# Patient Record
Sex: Male | Born: 1950 | Race: White | Hispanic: No | Smoking: Never smoker
Health system: Southern US, Community
[De-identification: ages and names within clinical notes are randomized; demographics above are authoritative.]

## PROBLEM LIST (undated history)

## (undated) HISTORY — PX: PROSTATE BIOPSY: SHX241

## (undated) HISTORY — PX: APPENDECTOMY: SHX54

## (undated) HISTORY — PX: CATARACT EXTRACTION: SUR2

## (undated) HISTORY — PX: VASECTOMY: SHX75

## (undated) HISTORY — PX: HERNIA REPAIR: SHX51

## (undated) HISTORY — PX: VASECTOMY REVERSAL: SHX243

---

## 2022-04-03 ENCOUNTER — Other Ambulatory Visit: Payer: Self-pay

## 2022-04-06 ENCOUNTER — Ambulatory Visit: Payer: Medicare Other | Admitting: Gastroenterology

## 2022-04-06 ENCOUNTER — Encounter: Payer: Self-pay | Admitting: Gastroenterology

## 2022-04-06 VITALS — BP 144/90 | HR 46 | Temp 97.8°F | Ht 66.0 in | Wt 139.0 lb

## 2022-04-06 DIAGNOSIS — R1032 Left lower quadrant pain: Secondary | ICD-10-CM

## 2022-04-06 DIAGNOSIS — R197 Diarrhea, unspecified: Secondary | ICD-10-CM

## 2022-04-06 NOTE — Patient Instructions (Addendum)
Your CT scan has been scheduled for April 10, 2022 arrive at 12:30pm to [facility] Entrance.   You can not have anything to eat or drink 4 hours prior.  You will need to go pick up oral prep today.  If you need to cancel or reschedule, please call [phone]

## 2022-04-06 NOTE — Progress Notes (Signed)
Gastroenterology Consultation  Referring Provider:     [name], MD Primary Care Physician:  Jason Hartigan, MD Primary Gastroenterologist:  Dr. Allen Dunn     Reason for Consultation:     Abdominal pain and diarrhea        HPI:   Jason Dunn is a 71 y.o. y/o male referred for consultation & management of Abdominal pain and diarrhea by Dr. Ellison Dunn, Jason Noa, MD.  This patient comes in today with a report of abdominal pain.  He reports that he started having diarrhea and the left-sided abdominal pain the Friday before Memorial Day.  The patient had seen his primary care provider and reported that he was given antibiotics for diverticulitis.  The patient reports that his had abdominal pain for a long time but not as bad as it has been recently and the diarrhea has been bothersome.  There is no report of any unexplained weight loss fevers chills nausea vomiting black stools or bloody stools.  The patient states he was started on Flagyl and Cipro.  He denies having any stool cultures sent off for his diarrhea nor was he sent for a CT scan of the abdomen.  He reports that he has had 3 colonoscopies in the past and underwent normal with his last colonoscopy being in 2017.  No past medical history on file.    Prior to Admission medications   Medication Sig Start Date End Date Taking? Authorizing Provider  amLODipine (NORVASC) 5 MG tablet Take 5 mg by mouth daily.   Yes [provider]  atorvastatin (LIPITOR) 20 MG tablet Take 20 mg by mouth daily.   Yes [provider]  brinzolamide (AZOPT) 1 % ophthalmic suspension 1 drop 3 (three) times daily.   Yes [provider]  ciprofloxacin (CIPRO) 500 MG tablet Take 500 mg by mouth 2 (two) times daily. 03/30/22  Yes [provider]  dorzolamide-timolol (COSOPT) 22.3-6.8 MG/ML ophthalmic solution 1 drop 2 (two) times daily.   Yes [provider]  finasteride (PROSCAR) 5 MG tablet Take 5 mg by mouth  daily.   Yes [provider]  gabapentin (NEURONTIN) 100 MG capsule Take 100 mg by mouth 3 (three) times daily.   Yes [provider]  gabapentin (NEURONTIN) 300 MG capsule Take 300 mg by mouth at bedtime. 01/27/22  Yes [provider]  latanoprost (XALATAN) 0.005 % ophthalmic solution 1 drop at bedtime.   Yes [provider]  metroNIDAZOLE (FLAGYL) 500 MG tablet Take 500 mg by mouth 3 (three) times daily. 03/30/22  Yes [provider]  Multiple Vitamin (MULTIVITAMIN) tablet Take 1 tablet by mouth daily.   Yes [provider]  naproxen (NAPROSYN) 500 MG tablet Take 500 mg by mouth 2 (two) times daily with a meal.   Yes [provider]  pantoprazole (PROTONIX) 40 MG tablet Take 40 mg by mouth daily.   Yes [provider]  sertraline (ZOLOFT) 100 MG tablet Take 100 mg by mouth daily.   Yes [provider]  silodosin (RAPAFLO) 8 MG CAPS capsule Take 8 mg by mouth daily. 03/04/22  Yes [provider]  tadalafil (CIALIS) 5 MG tablet Take 5 mg by mouth daily. 03/18/22  Yes [provider]  vitamin B-12 (CYANOCOBALAMIN) 1000 MCG tablet Take 1,000 mcg by mouth daily.   Yes [provider]    No family history on file.   Social History   Tobacco Use   Smoking status: Never  Smokeless tobacco: Never  Substance Use Topics   Alcohol use: Yes   Drug use: Never    Allergies as of 04/06/2022 - Review Complete 04/06/2022  Allergen Reaction Noted   Aloe vera Rash 04/03/2022    Review of Systems:    All systems reviewed and negative except where noted in HPI.   Physical Exam:  BP (!) 144/90   Pulse (!) 46   Temp 97.8 F (36.6 C) (Oral)   Ht 5\' 6"  (1.676 m)   Wt 139 lb (63 kg)   BMI 22.44 kg/m  No LMP for male patient. General:   Alert,  Well-developed, well-nourished, pleasant and cooperative in NAD Head:  Normocephalic and atraumatic. Eyes:  Sclera clear, no icterus.   Conjunctiva  pink. Ears:  Normal auditory acuity. Neck:  Supple; no masses or thyromegaly. Lungs:  Respirations even and unlabored.  Clear throughout to auscultation.   No wheezes, crackles, or rhonchi. No acute distress. Heart:  Regular rate and rhythm; no murmurs, clicks, rubs, or gallops. Abdomen:  Normal bowel sounds.  No bruits.  Soft, Positive tenderness in the left lower quadrant and non-distended without masses, hepatosplenomegaly or hernias noted.  No guarding tenderness.  Rebound tenderness was present. Negative Carnett sign.   Rectal:  Deferred.  Pulses:  Normal pulses noted. Extremities:  No clubbing or edema.  No cyanosis. Neurologic:  Alert and oriented x3;  grossly normal neurologically. Skin:  Intact without significant lesions or rashes.  No jaundice. Lymph Nodes:  No significant cervical adenopathy. Psych:  Alert and cooperative. Normal mood and affect.  Imaging Studies: No results found.  Assessment and Plan:   Jason Dunn is a 71 y.o. y/o male Who comes in today with left-sided abdominal pain with mild rebound tenderness and diarrhea.  The patient will have a CT scan of the abdomen done to look for signs of complicated diverticulitis.  He denies being told that he had diverticulosis on any of his previous colonoscopies.  The patient will also have a GI stool panel sent off to look for any pathogens that may be causing him to have the diarrhea.  The patient has been explained the plan and agrees with it.    Lucilla Lame, MD. [facility] Regal    Note: This dictation was prepared with Dragon dictation along with smaller phrase technology. Any transcriptional errors that result from this process are unintentional.

## 2022-04-07 ENCOUNTER — Other Ambulatory Visit: Payer: Self-pay | Admitting: Gastroenterology

## 2022-04-10 ENCOUNTER — Ambulatory Visit
Admission: RE | Admit: 2022-04-10 | Discharge: 2022-04-10 | Disposition: A | Discharge status: Home / Self Care | Payer: Medicare Other | Source: Ambulatory Visit | Attending: Gastroenterology | Admitting: Gastroenterology

## 2022-04-10 DIAGNOSIS — R1032 Left lower quadrant pain: Secondary | ICD-10-CM | POA: Diagnosis present

## 2022-04-10 LAB — GI PROFILE, STOOL, PCR

## 2022-04-10 LAB — POCT I-STAT CREATININE: Creatinine, Ser: 0.8 mg/dL (ref 0.61–1.24)

## 2022-04-10 MED ORDER — IOHEXOL 300 MG/ML  SOLN
100.0000 mL | Freq: Once | INTRAMUSCULAR | Status: AC | PRN
Start: 2022-04-10 — End: 2022-04-10
  Administered 2022-04-10: 100 mL via INTRAVENOUS

## 2022-04-11 ENCOUNTER — Encounter: Payer: Self-pay | Admitting: Gastroenterology

## 2022-06-30 ENCOUNTER — Other Ambulatory Visit: Payer: Self-pay | Admitting: Family Medicine

## 2022-08-25 ENCOUNTER — Ambulatory Visit: Payer: Self-pay | Admitting: Surgery
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
## 2023-05-25 IMAGING — CT CT ABD-PELV W/ CM
2 of 5 series · 16 of 46 positions shown, 18 images · IV contrast (APPLIED)
Comparison: None Available.

CLINICAL DATA: Left lower quadrant pain and diarrhea for several
weeks.

EXAM:
CT ABDOMEN AND PELVIS WITH CONTRAST
TECHNIQUE: Multidetector CT imaging of the abdomen and pelvis was performed
using the standard protocol following bolus administration of
intravenous contrast.

[Series 3: coronal st · coronal · 0.68mm/px · 3 of 88 slices shown]
[im 30/88  soft-tissue]
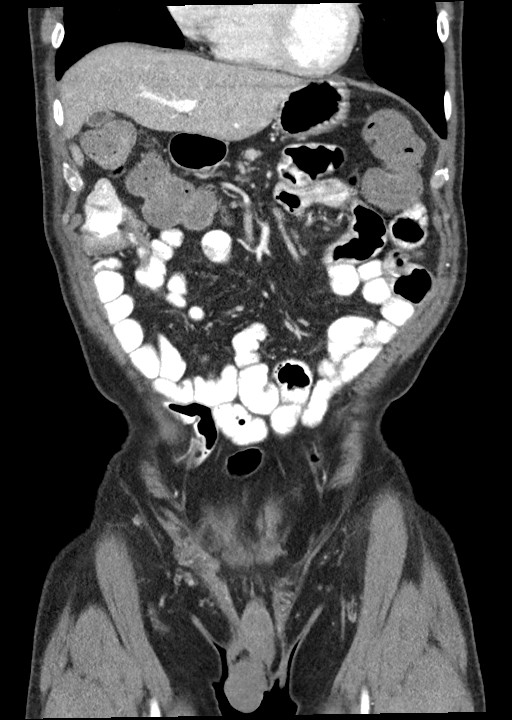
[im 39/88  soft-tissue]
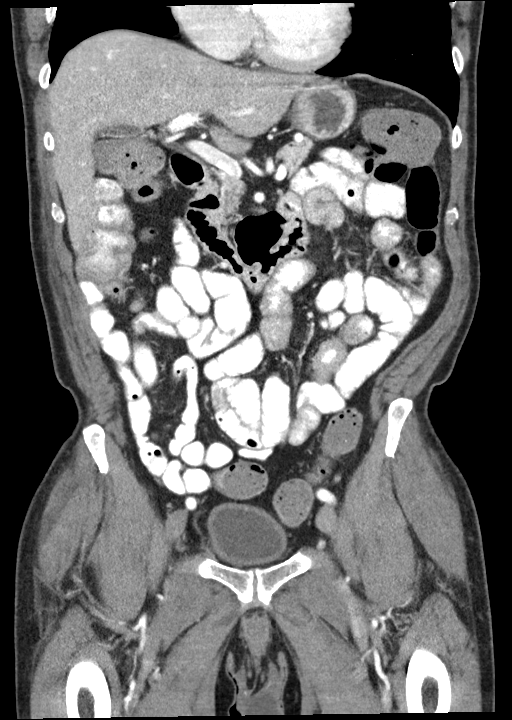
[im 49/88  soft-tissue]
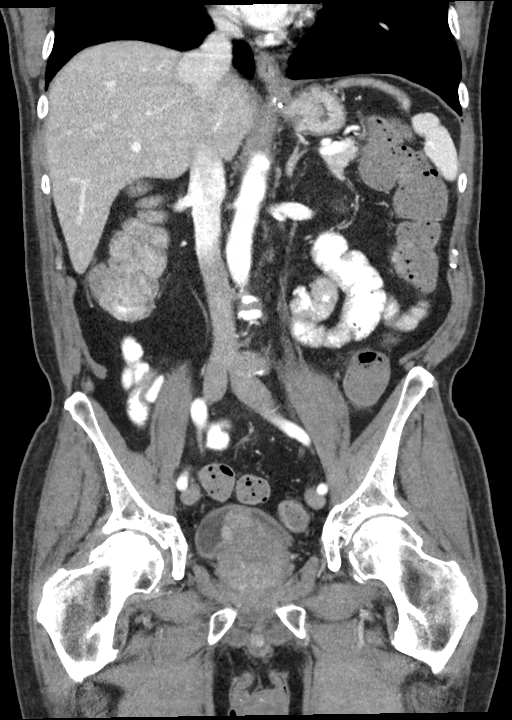

[Series 7: axial st · axial · 0.73mm/px · z∈[-507,-77]mm · 13 of 98 slices shown, 15 images]
[im 6/98  soft-tissue]
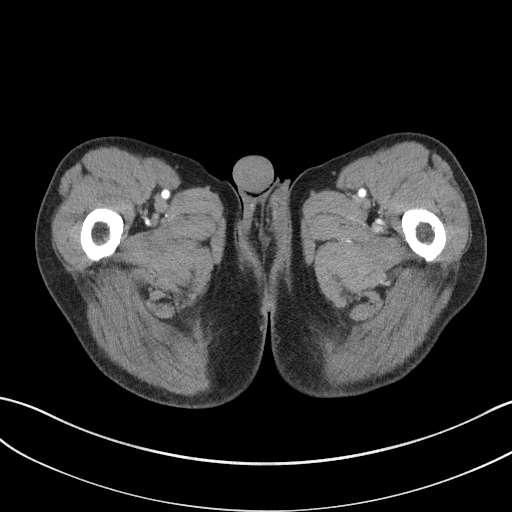
[im 6/98  bone]
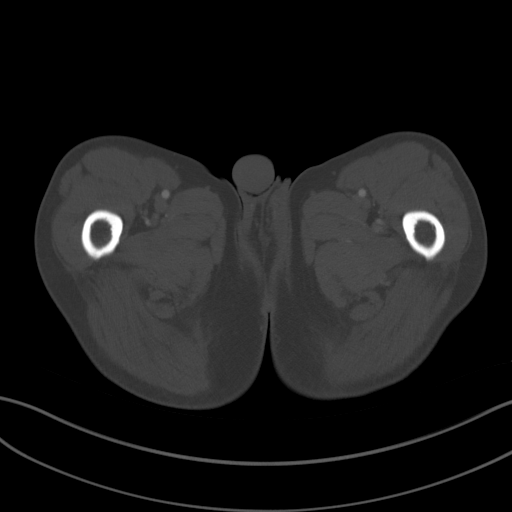
[im 16/98  soft-tissue]
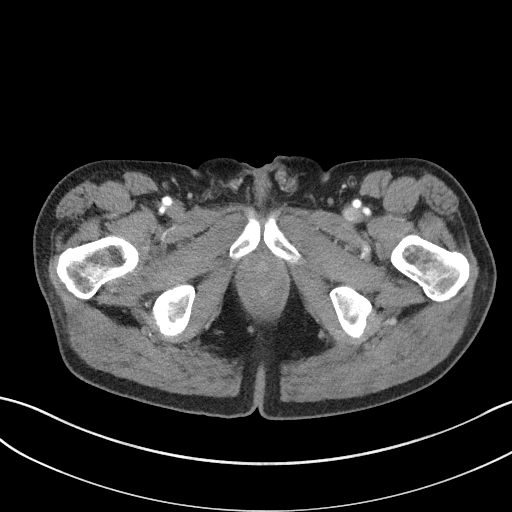
[im 21/98  soft-tissue]
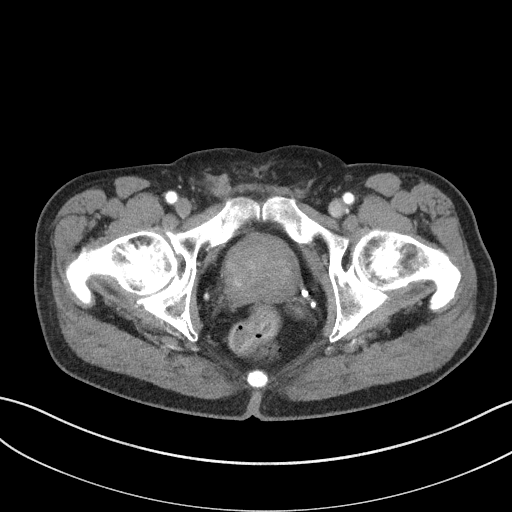
[im 26/98  soft-tissue]
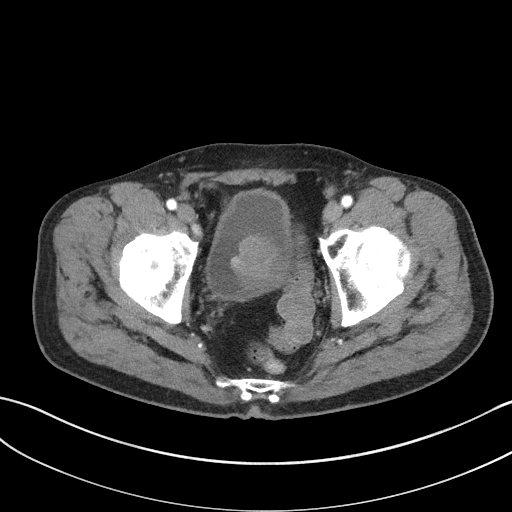
[im 36/98  soft-tissue]
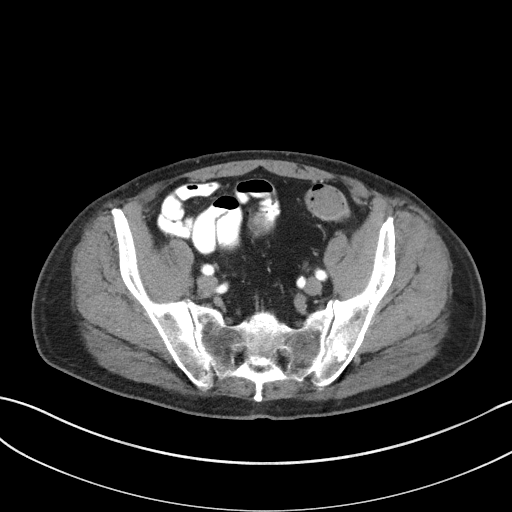
[im 41/98  soft-tissue]
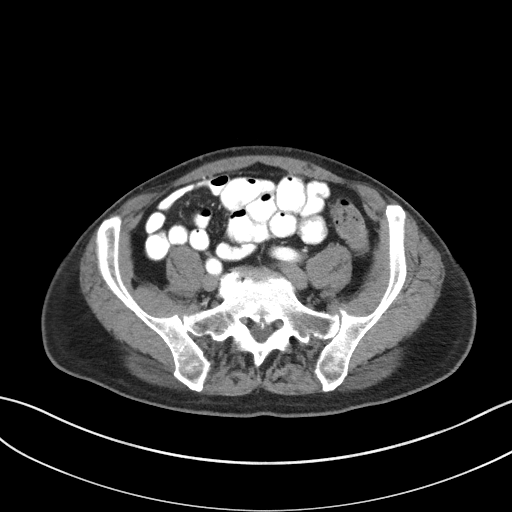
[im 52/98  soft-tissue]
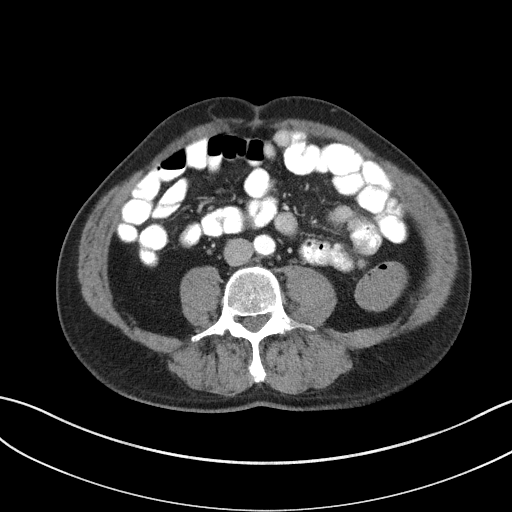
[im 57/98  soft-tissue]
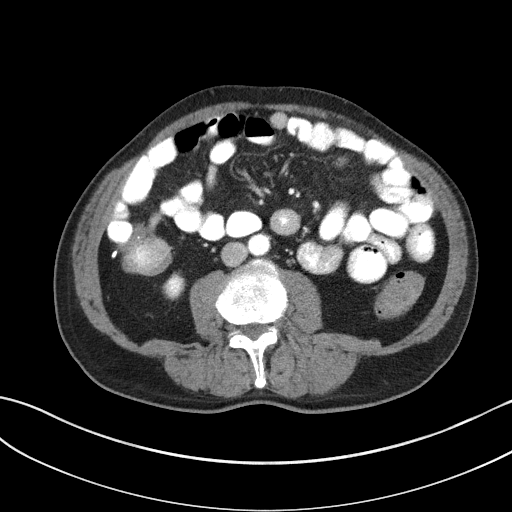
[im 62/98  soft-tissue]
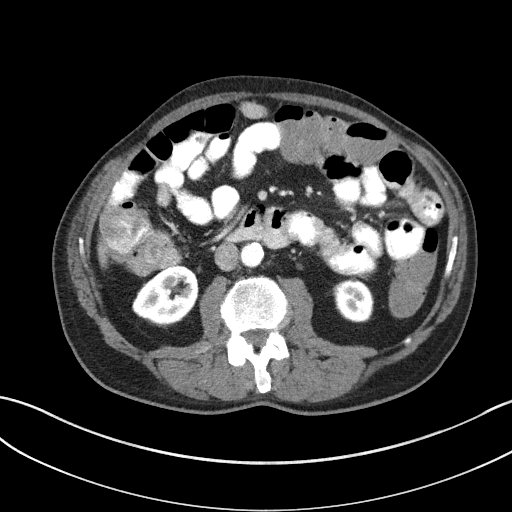
[im 62/98  bone]
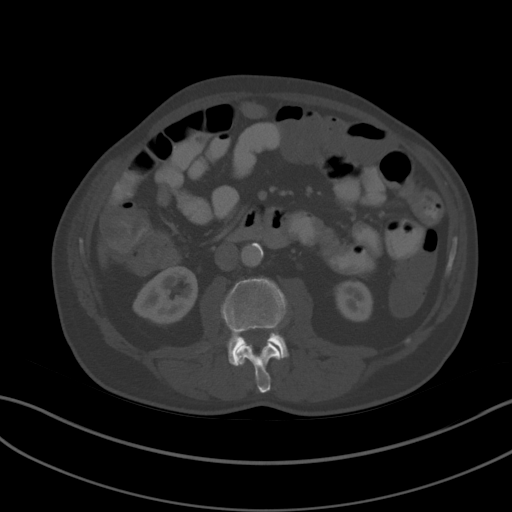
[im 72/98  soft-tissue]
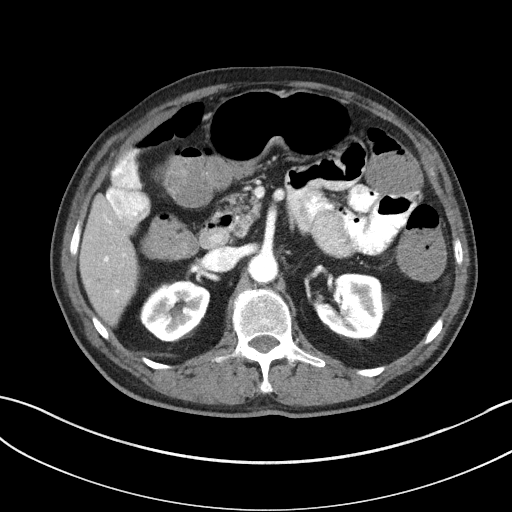
[im 77/98  soft-tissue]
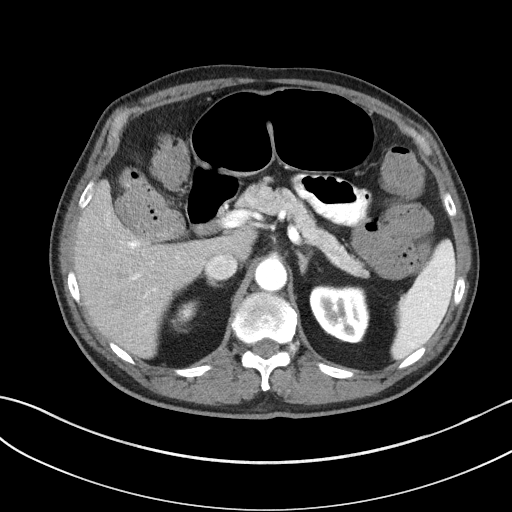
[im 82/98  soft-tissue]
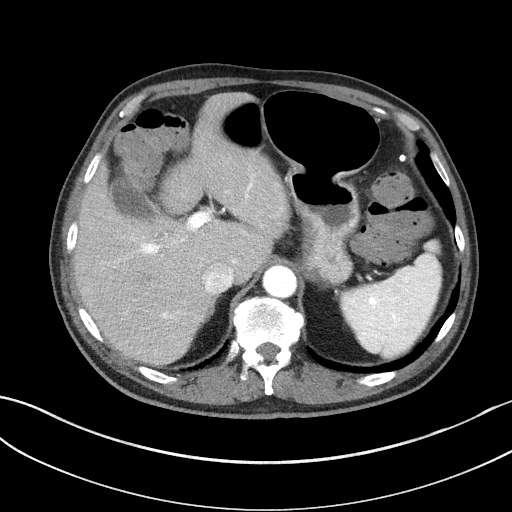
[im 92/98  soft-tissue]
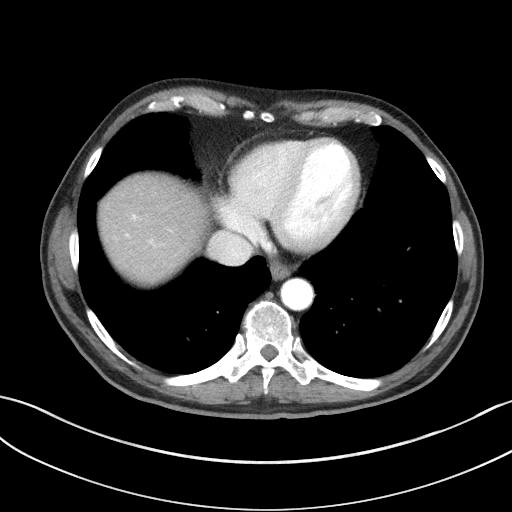

[16 of 46 positions shown; findings below may reference images not displayed]

RADIATION DOSE REDUCTION: This exam was performed according to the
departmental dose-optimization program which includes automated
exposure control, adjustment of the mA and/or kV according to
patient size and/or use of iterative reconstruction technique.

CONTRAST:  100mL OMNIPAQUE IOHEXOL 300 MG/ML  SOLN
FINDINGS: Lower Chest: No acute findings.

Hepatobiliary: No hepatic masses identified. Tiny right hepatic lobe
cyst noted. Gallbladder is unremarkable. No evidence of biliary
ductal dilatation.

Pancreas:  No mass or inflammatory changes.

Spleen: Within normal limits in size and appearance.

Adrenals/Urinary Tract: No masses identified. No evidence of
ureteral calculi or hydronephrosis.

Stomach/Bowel: No evidence of obstruction, inflammatory process or
abnormal fluid collections.

Vascular/Lymphatic: No pathologically enlarged lymph nodes. No acute
vascular findings. Aortic atherosclerotic calcification incidentally
noted.

Reproductive: Moderately enlarged prostate gland, with indentation
of the bladder base.

Other:  None.

Musculoskeletal:  No suspicious bone lesions identified.
IMPRESSION: No acute findings within the abdomen or pelvis.

Moderately enlarged prostate.

Aortic Atherosclerosis ([id number]).

## 2024-07-15 ENCOUNTER — Other Ambulatory Visit: Payer: Self-pay | Admitting: Family Medicine

## 2024-07-15 DIAGNOSIS — M7989 Other specified soft tissue disorders: Secondary | ICD-10-CM

## 2024-07-17 ENCOUNTER — Ambulatory Visit
Admission: RE | Admit: 2024-07-17 | Discharge: 2024-07-17 | Disposition: A | Discharge status: Home / Self Care | Source: Ambulatory Visit | Attending: Family Medicine | Admitting: Family Medicine

## 2024-07-17 DIAGNOSIS — M7989 Other specified soft tissue disorders: Secondary | ICD-10-CM | POA: Diagnosis present
